# Patient Record
Sex: Female | Born: 2000 | Race: Black or African American | Hispanic: No | Marital: Single
Health system: Southern US, Community
[De-identification: ages and names within clinical notes are randomized; demographics above are authoritative.]

## PROBLEM LIST (undated history)

## (undated) DIAGNOSIS — I10 Essential (primary) hypertension: Secondary | ICD-10-CM

---
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
## 2019-10-25 ENCOUNTER — Ambulatory Visit: Payer: Self-pay | Attending: Internal Medicine

## 2019-10-25 DIAGNOSIS — Z23 Encounter for immunization: Secondary | ICD-10-CM

## 2019-10-25 NOTE — Progress Notes (Signed)
   Covid-19 Vaccination Clinic  Name:  Lynnette Pote    MRN: [id number] DOB: [birth year]  10/25/2019  Ms. Ogvonna was observed post Covid-19 immunization for 15 minutes without incident. She was provided with Vaccine Information Sheet and instruction to access the V-Safe system.   Ms. Ann Maki was instructed to call 911 with any severe reactions post vaccine: Marland Kitchen Difficulty breathing  . Swelling of face and throat  . A fast heartbeat  . A bad rash all over body  . Dizziness and weakness   Immunizations Administered    Name Date Dose VIS Date Route   Pfizer COVID-19 Vaccine 10/25/2019 11:42 AM 0.3 mL 08/03/2018 Intramuscular   Manufacturer: [employer], [employer]   Lot: [id number]   NDC: [address]-9

## 2019-11-15 ENCOUNTER — Ambulatory Visit: Payer: Self-pay | Attending: Internal Medicine

## 2019-11-28 ENCOUNTER — Ambulatory Visit: Payer: Self-pay | Attending: Internal Medicine

## 2019-11-28 DIAGNOSIS — Z23 Encounter for immunization: Secondary | ICD-10-CM

## 2019-11-28 NOTE — Progress Notes (Signed)
   Covid-19 Vaccination Clinic  Name:  Dawn Fry    MRN: [id number] DOB: 11-25-2000  11/28/2019  Dawn Fry was observed post Covid-19 immunization for 15 minutes without incident. She was provided with Vaccine Information Sheet and instruction to access the V-Safe system.   Dawn Fry was instructed to call 911 with any severe reactions post vaccine: Marland Kitchen Difficulty breathing  . Swelling of face and throat  . A fast heartbeat  . A bad rash all over body  . Dizziness and weakness   Immunizations Administered    Name Date Dose VIS Date Route   Pfizer COVID-19 Vaccine 11/28/2019  1:17 PM 0.3 mL 08/03/2018 Intramuscular   Manufacturer: [employer], [employer]   Lot: [id number]   NDC: [address]-0

## 2022-07-04 ENCOUNTER — Emergency Department (HOSPITAL_BASED_OUTPATIENT_CLINIC_OR_DEPARTMENT_OTHER)
Admission: EM | Admit: 2022-07-04 | Discharge: 2022-07-04 | Disposition: A | Discharge status: Home / Self Care | Payer: Managed Care, Other (non HMO) | Attending: Emergency Medicine | Admitting: Emergency Medicine

## 2022-07-04 ENCOUNTER — Other Ambulatory Visit: Payer: Self-pay

## 2022-07-04 ENCOUNTER — Encounter (HOSPITAL_BASED_OUTPATIENT_CLINIC_OR_DEPARTMENT_OTHER): Payer: Self-pay | Admitting: Emergency Medicine

## 2022-07-04 DIAGNOSIS — N921 Excessive and frequent menstruation with irregular cycle: Secondary | ICD-10-CM | POA: Diagnosis not present

## 2022-07-04 DIAGNOSIS — N939 Abnormal uterine and vaginal bleeding, unspecified: Secondary | ICD-10-CM | POA: Diagnosis present

## 2022-07-04 HISTORY — DX: Essential (primary) hypertension: I10

## 2022-07-04 LAB — PREGNANCY, URINE: Preg Test, Ur: NEGATIVE

## 2022-07-04 LAB — CBC WITH DIFFERENTIAL/PLATELET
Abs Immature Granulocytes: 0.01 10*3/uL (ref 0.00–0.07)
Basophils Absolute: 0 10*3/uL (ref 0.0–0.1)
Basophils Relative: 0 %
Eosinophils Absolute: 0 10*3/uL (ref 0.0–0.5)
Eosinophils Relative: 0 %
HCT: 34.8 % — ABNORMAL LOW (ref 36.0–46.0)
Hemoglobin: 10.9 g/dL — ABNORMAL LOW (ref 12.0–15.0)
Immature Granulocytes: 0 %
Lymphocytes Relative: 34 %
Lymphs Abs: 2.3 10*3/uL (ref 0.7–4.0)
MCH: 23.7 pg — ABNORMAL LOW (ref 26.0–34.0)
MCHC: 31.3 g/dL (ref 30.0–36.0)
MCV: 75.8 fL — ABNORMAL LOW (ref 80.0–100.0)
Monocytes Absolute: 0.4 10*3/uL (ref 0.1–1.0)
Monocytes Relative: 5 %
Neutro Abs: 4.1 10*3/uL (ref 1.7–7.7)
Neutrophils Relative %: 61 %
Platelets: 487 10*3/uL — ABNORMAL HIGH (ref 150–400)
RBC: 4.59 MIL/uL (ref 3.87–5.11)
RDW: 16 % — ABNORMAL HIGH (ref 11.5–15.5)
WBC: 6.7 10*3/uL (ref 4.0–10.5)
nRBC: 0 % (ref 0.0–0.2)

## 2022-07-04 LAB — BASIC METABOLIC PANEL
Anion gap: 7 (ref 5–15)
BUN: 10 mg/dL (ref 6–20)
CO2: 27 mmol/L (ref 22–32)
Calcium: 8.5 mg/dL — ABNORMAL LOW (ref 8.9–10.3)
Chloride: 100 mmol/L (ref 98–111)
Creatinine, Ser: 0.89 mg/dL (ref 0.44–1.00)
GFR, Estimated: >60 mL/min (ref >60)
Glucose, Bld: 116 mg/dL — ABNORMAL HIGH (ref 70–99)
Potassium: 3 mmol/L — ABNORMAL LOW (ref 3.5–5.1)
Sodium: 134 mmol/L — ABNORMAL LOW (ref 135–145)

## 2022-07-04 MED ORDER — ORTHO-NOVUM 1/35 (28) 1-35 MG-MCG PO TABS: 1.0000 | ORAL_TABLET | Freq: Three times a day (TID) | ORAL | 0 refills | Status: AC

## 2022-07-04 NOTE — ED Notes (Signed)
Discharge paperwork reviewed entirely with patient, including Rx's and follow up care. Pain was under control. Pt verbalized understanding as well as all parties involved. No questions or concerns voiced at the time of discharge. No acute distress noted.   Pt ambulated out to PVA without incident or assistance.

## 2022-07-04 NOTE — Discharge Instructions (Signed)
You were seen in the emergency department tonight for vaginal bleeding. Given that your labs were largely reassuring, I have prescribed you a short course of oral contraceptives with the aim of stopping the vaginal bleeding. You should plan to follow up with your gynecologist as currently planned for further evaluation and treatment if needed at that time.

## 2022-07-04 NOTE — ED Provider Notes (Signed)
[name] Provider Note   CSN: [id number] Arrival date & time: 07/04/22  1441     History Chief Complaint  Patient presents with   Vaginal Bleeding    Dawn Fry is a 22 y.o. female.   Vaginal Bleeding Associated symptoms: no abdominal pain, no dysuria and no fever   Patient presents emergency department complaints of vaginal bleeding.  She reports that this vaginal bleeding has been heavier than normal since late December.  She reports that she is going through 3 tampons per hour at this time.  Reports that she has previously had several episodes of heavy vaginal bleeding which would then resolve without complications for several months at a time.  Patient denies any known history of any coagulation disorders, not sexually active, not pregnant.  Patient denies any other symptoms such as chest pain, shortness of breath, abdominal pain, lightheadedness.     Home Medications Prior to Admission medications   Medication Sig Start Date End Date Taking? Authorizing Provider  norethindrone-ethinyl estradiol 1/35 ([place] 1/35, 28,) tablet Take 1 tablet by mouth in the morning, at noon, and at bedtime for 7 days. 07/04/22 07/11/22 Yes Luvenia Heller, PA-C      Allergies    Patient has no known allergies.    Review of Systems   Review of Systems  Constitutional:  Negative for chills and fever.  Respiratory:  Negative for chest tightness and shortness of breath.   Gastrointestinal:  Negative for abdominal pain.  Genitourinary:  Positive for vaginal bleeding. Negative for dysuria and hematuria.  Neurological:  Negative for weakness and headaches.  All other systems reviewed and are negative.   Physical Exam Updated Vital Signs BP 133/79   Pulse 81   Temp 98.4 F (36.9 C) (Oral)   Resp 16   SpO2 98%  Physical Exam Vitals and nursing note reviewed.  Constitutional:      Appearance: Normal appearance.  HENT:     Head:  Normocephalic and atraumatic.  Eyes:     Conjunctiva/sclera: Conjunctivae normal.     Pupils: Pupils are equal, round, and reactive to light.  Cardiovascular:     Rate and Rhythm: Normal rate and regular rhythm.     Pulses: Normal pulses.     Heart sounds: Normal heart sounds.  Pulmonary:     Effort: Pulmonary effort is normal.     Breath sounds: Normal breath sounds.  Abdominal:     General: Abdomen is flat.  Musculoskeletal:        General: Normal range of motion.  Skin:    General: Skin is warm and dry.     Capillary Refill: Capillary refill takes less than 2 seconds.  Neurological:     General: No focal deficit present.     Mental Status: She is alert.  Psychiatric:        Mood and Affect: Mood normal.     ED Results / Procedures / Treatments   Labs (all labs ordered are listed, but only abnormal results are displayed) Labs Reviewed  CBC WITH DIFFERENTIAL/PLATELET - Abnormal; Notable for the following components:      Result Value   Hemoglobin 10.9 (*)    HCT 34.8 (*)    MCV 75.8 (*)    MCH 23.7 (*)    RDW 16.0 (*)    Platelets 487 (*)    All other components within normal limits  BASIC METABOLIC PANEL - Abnormal; Notable for the following components:  Sodium 134 (*)    Potassium 3.0 (*)    Glucose, Bld 116 (*)    Calcium 8.5 (*)    All other components within normal limits  PREGNANCY, URINE    EKG None  Radiology No results found.  Procedures Procedures   Medications Ordered in ED Medications - No data to display  ED Course/ Medical Decision Making/ A&P                           Medical Decision Making Amount and/or Complexity of Data Reviewed Labs: ordered.   This patient presents to the ED for concern of vaginal bleeding.  Differential diagnosis includes leiomyoma, uterine fibroids, miscarriage, STI    Lab Tests:  I Ordered, and personally interpreted labs.  The pertinent results include: Decreased hemoglobin of 10.9 decreased medic at  34.8.  Labs otherwise normal    Medicines ordered and prescription drug management:  I have reviewed the patients home medicines and have made adjustments as needed   Problem List / ED Course:  Patient presents emergency department complaints of vaginal bleeding.  Patient reports that she has had increased vaginal bleeding for the past month.  Patient denies any prior known uterine abnormalities or coagulopathies.  Patient reports she is currently saturating 3 tampons per hour.  Not currently on any oral contraceptives.  Based on clinical presentation, this appears to be increased vaginal bleeding which can be managed in the outpatient setting not emergently with oral contraceptives to decrease blood flow and stop heavy menses.  Advised patient of this and she was agreeable to plan to for outpatient management with oral contraceptives and follow-up with gynecologist.  Patient did have any further questions at the end of this assessment.   Final Clinical Impression(s) / ED Diagnoses Final diagnoses:  Menometrorrhagia    Rx / DC Orders ED Discharge Orders          Ordered    norethindrone-ethinyl estradiol 1/35 ([place] 1/35, 28,) tablet  3 times daily        07/04/22 1740              Vladimir Creeks 07/04/22 1918    Drenda Freeze, MD 07/04/22 [phone]

## 2022-07-04 NOTE — ED Triage Notes (Signed)
Patient presents to ED via POV from home. Here with heavy vaginal bleeding during menstrual cycle. Patient reports going through 3 tampons an hour. Reports fatigue and "mental fog". Denies pain. Bleeding has been present since December.

## 2022-07-04 NOTE — ED Notes (Signed)
Patient has urine specimen in lab 

## 2022-08-03 ENCOUNTER — Emergency Department (HOSPITAL_BASED_OUTPATIENT_CLINIC_OR_DEPARTMENT_OTHER)
Admission: EM | Admit: 2022-08-03 | Discharge: 2022-08-03 | Disposition: A | Discharge status: Home / Self Care | Payer: Managed Care, Other (non HMO) | Attending: Emergency Medicine | Admitting: Emergency Medicine

## 2022-08-03 ENCOUNTER — Emergency Department (HOSPITAL_BASED_OUTPATIENT_CLINIC_OR_DEPARTMENT_OTHER): Payer: Managed Care, Other (non HMO)

## 2022-08-03 ENCOUNTER — Other Ambulatory Visit: Payer: Self-pay

## 2022-08-03 DIAGNOSIS — R5383 Other fatigue: Secondary | ICD-10-CM | POA: Diagnosis not present

## 2022-08-03 DIAGNOSIS — R42 Dizziness and giddiness: Secondary | ICD-10-CM | POA: Diagnosis not present

## 2022-08-03 DIAGNOSIS — I10 Essential (primary) hypertension: Secondary | ICD-10-CM | POA: Diagnosis not present

## 2022-08-03 DIAGNOSIS — R109 Unspecified abdominal pain: Secondary | ICD-10-CM | POA: Diagnosis present

## 2022-08-03 DIAGNOSIS — N938 Other specified abnormal uterine and vaginal bleeding: Secondary | ICD-10-CM | POA: Insufficient documentation

## 2022-08-03 DIAGNOSIS — N921 Excessive and frequent menstruation with irregular cycle: Secondary | ICD-10-CM | POA: Insufficient documentation

## 2022-08-03 LAB — COMPREHENSIVE METABOLIC PANEL
ALT: 12 U/L (ref 0–44)
AST: 14 U/L — ABNORMAL LOW (ref 15–41)
Albumin: 3.6 g/dL (ref 3.5–5.0)
Alkaline Phosphatase: 62 U/L (ref 38–126)
Anion gap: 8 (ref 5–15)
BUN: 6 mg/dL (ref 6–20)
CO2: 27 mmol/L (ref 22–32)
Calcium: 8.7 mg/dL — ABNORMAL LOW (ref 8.9–10.3)
Chloride: 103 mmol/L (ref 98–111)
Creatinine, Ser: 0.91 mg/dL (ref 0.44–1.00)
GFR, Estimated: >60 mL/min (ref >60)
Glucose, Bld: 111 mg/dL — ABNORMAL HIGH (ref 70–99)
Potassium: 3.5 mmol/L (ref 3.5–5.1)
Sodium: 138 mmol/L (ref 135–145)
Total Bilirubin: 0.8 mg/dL (ref 0.3–1.2)
Total Protein: 8.1 g/dL (ref 6.5–8.1)

## 2022-08-03 LAB — URINALYSIS, MICROSCOPIC (REFLEX): RBC / HPF: >50 RBC/hpf (ref 0–5)

## 2022-08-03 LAB — CBC WITH DIFFERENTIAL/PLATELET
Abs Immature Granulocytes: 0.01 10*3/uL (ref 0.00–0.07)
Basophils Absolute: 0 10*3/uL (ref 0.0–0.1)
Basophils Relative: 0 %
Eosinophils Absolute: 0 10*3/uL (ref 0.0–0.5)
Eosinophils Relative: 0 %
HCT: 31.3 % — ABNORMAL LOW (ref 36.0–46.0)
Hemoglobin: 9.5 g/dL — ABNORMAL LOW (ref 12.0–15.0)
Immature Granulocytes: 0 %
Lymphocytes Relative: 26 %
Lymphs Abs: 2 10*3/uL (ref 0.7–4.0)
MCH: 22.7 pg — ABNORMAL LOW (ref 26.0–34.0)
MCHC: 30.4 g/dL (ref 30.0–36.0)
MCV: 74.9 fL — ABNORMAL LOW (ref 80.0–100.0)
Monocytes Absolute: 0.5 10*3/uL (ref 0.1–1.0)
Monocytes Relative: 7 %
Neutro Abs: 5 10*3/uL (ref 1.7–7.7)
Neutrophils Relative %: 67 %
Platelets: 482 10*3/uL — ABNORMAL HIGH (ref 150–400)
RBC: 4.18 MIL/uL (ref 3.87–5.11)
RDW: 15.8 % — ABNORMAL HIGH (ref 11.5–15.5)
WBC: 7.5 10*3/uL (ref 4.0–10.5)
nRBC: 0 % (ref 0.0–0.2)

## 2022-08-03 LAB — URINALYSIS, ROUTINE W REFLEX MICROSCOPIC
Bilirubin Urine: NEGATIVE
Glucose, UA: NEGATIVE mg/dL
Ketones, ur: NEGATIVE mg/dL
Leukocytes,Ua: NEGATIVE
Nitrite: NEGATIVE
Protein, ur: 30 mg/dL — AB
Specific Gravity, Urine: 1.02 (ref 1.005–1.030)
pH: 6 (ref 5.0–8.0)

## 2022-08-03 LAB — WET PREP, GENITAL
Clue Cells Wet Prep HPF POC: NONE SEEN
Sperm: NONE SEEN
Trich, Wet Prep: NONE SEEN
WBC, Wet Prep HPF POC: <10 (ref <10)
Yeast Wet Prep HPF POC: NONE SEEN

## 2022-08-03 LAB — PREGNANCY, URINE: Preg Test, Ur: NEGATIVE

## 2022-08-03 MED ORDER — KETOROLAC TROMETHAMINE 30 MG/ML IJ SOLN
30.0000 mg | Freq: Once | INTRAMUSCULAR | Status: AC
  Administered 2022-08-03: 30 mg via INTRAVENOUS
  Filled 2022-08-03: qty 1

## 2022-08-03 NOTE — ED Provider Notes (Signed)
[facility] Provider Note   CSN: [id number] Arrival date & time: 08/03/22  1727     History {Add pertinent medical, surgical, social history, OB history to HPI:1} Chief Complaint  Patient presents with   Abdominal Pain    Dawn Fry is a 22 y.o. female.  HPI     3 months of vaginal bleeding, every day Came to ED, started on birth control Went to [facility], started on different birth control 2 2wks ago For the past 2 weeks more severe, passing large clots, changing pads 3 times an hour for 2 weeks, , in morning is the worst, decreasing as day goes on  Pain sharp, lingers then dissipates, 2-3 days ago was right, now left  No nausea, vomiting, urinary symptoms, fever Feels cold sometimes Does feel fatigue, lightheaded  7/10 pain now  Past Medical History:  Diagnosis Date   Hypertension       Home Medications Prior to Admission medications   Medication Sig Start Date End Date Taking? Authorizing Provider  norethindrone-ethinyl estradiol 1/35 ([place] 1/35, 28,) tablet Take 1 tablet by mouth in the morning, at noon, and at bedtime for 7 days. 07/04/22 07/11/22  Luvenia Heller, PA-C      Allergies    Patient has no known allergies.    Review of Systems   Review of Systems  Physical Exam Updated Vital Signs BP 135/87   Pulse 93   Temp 98.9 F (37.2 C) (Oral)   Resp 15   SpO2 100%  Physical Exam  ED Results / Procedures / Treatments   Labs (all labs ordered are listed, but only abnormal results are displayed) Labs Reviewed  URINALYSIS, ROUTINE W REFLEX MICROSCOPIC - Abnormal; Notable for the following components:      Result Value   APPearance CLOUDY (*)    Hgb urine dipstick LARGE (*)    Protein, ur 30 (*)    All other components within normal limits  CBC WITH DIFFERENTIAL/PLATELET - Abnormal; Notable for the following components:   Hemoglobin 9.5 (*)    HCT 31.3 (*)    MCV 74.9 (*)    MCH 22.7 (*)     RDW 15.8 (*)    Platelets 482 (*)    All other components within normal limits  URINALYSIS, MICROSCOPIC (REFLEX) - Abnormal; Notable for the following components:   Bacteria, UA FEW (*)    All other components within normal limits  PREGNANCY, URINE  COMPREHENSIVE METABOLIC PANEL    EKG None  Radiology No results found.  Procedures Procedures  {Document cardiac monitor, telemetry assessment procedure when appropriate:1}  Medications Ordered in ED Medications  ketorolac (TORADOL) 30 MG/ML injection 30 mg (has no administration in time range)    ED Course/ Medical Decision Making/ A&P   {   Click here for ABCD2, HEART and other calculatorsREFRESH Note before signing :1}                          Medical Decision Making Amount and/or Complexity of Data Reviewed Labs: ordered.  Risk Prescription drug management.   ***  {Document critical care time when appropriate:1} {Document review of labs and clinical decision tools ie heart score, Chads2Vasc2 etc:1}  {Document your independent review of radiology images, and any outside records:1} {Document your discussion with family members, caretakers, and with consultants:1} {Document social determinants of health affecting pt's care:1} {Document your decision making why or why not  admission, treatments were needed:1} Final Clinical Impression(s) / ED Diagnoses Final diagnoses:  None    Rx / DC Orders ED Discharge Orders     None

## 2022-08-03 NOTE — ED Triage Notes (Signed)
Pt arrives with c/o lower ABD cramping that has been ongoing for the past 2 weeks. Per pt, she also has had vaginal bleeding for 3 months. Pt started new birth control prescribed by OBGYN and has had no change in bleeding and AND cramping has gotten worse.

## 2022-08-04 LAB — T4, FREE: Free T4: 1.08 ng/dL (ref 0.61–1.12)

## 2022-08-04 LAB — GC/CHLAMYDIA PROBE AMP (~~LOC~~) NOT AT ARMC
Chlamydia: NEGATIVE
Comment: NEGATIVE
Comment: NORMAL
Neisseria Gonorrhea: NEGATIVE

## 2022-08-04 LAB — TSH: TSH: 3.122 u[IU]/mL (ref 0.350–4.500)
